# Patient Record
Sex: Male | Born: 1995 | Race: Black or African American | Hispanic: No | Marital: Single | Smoking: Never smoker
Health system: Southern US, Community
[De-identification: ages and names within clinical notes are randomized; demographics above are authoritative.]

---

## 2016-05-09 ENCOUNTER — Emergency Department (HOSPITAL_COMMUNITY): Payer: Self-pay

## 2016-05-09 ENCOUNTER — Inpatient Hospital Stay (HOSPITAL_COMMUNITY)
Admission: EM | Admit: 2016-05-09 | Discharge: 2016-05-11 | DRG: 200 | Disposition: A | Discharge status: Home / Self Care | Payer: Self-pay | Attending: General Surgery | Admitting: General Surgery

## 2016-05-09 ENCOUNTER — Encounter (HOSPITAL_COMMUNITY): Payer: Self-pay | Admitting: Emergency Medicine

## 2016-05-09 ENCOUNTER — Inpatient Hospital Stay (HOSPITAL_COMMUNITY): Payer: Self-pay

## 2016-05-09 DIAGNOSIS — S71011A Laceration without foreign body, right hip, initial encounter: Secondary | ICD-10-CM | POA: Diagnosis present

## 2016-05-09 DIAGNOSIS — Y907 Blood alcohol level of 200-239 mg/100 ml: Secondary | ICD-10-CM | POA: Diagnosis present

## 2016-05-09 DIAGNOSIS — J939 Pneumothorax, unspecified: Secondary | ICD-10-CM | POA: Diagnosis present

## 2016-05-09 DIAGNOSIS — S270XXA Traumatic pneumothorax, initial encounter: Principal | ICD-10-CM | POA: Diagnosis present

## 2016-05-09 DIAGNOSIS — W109XXA Fall (on) (from) unspecified stairs and steps, initial encounter: Secondary | ICD-10-CM | POA: Diagnosis present

## 2016-05-09 DIAGNOSIS — T148XXA Other injury of unspecified body region, initial encounter: Secondary | ICD-10-CM

## 2016-05-09 DIAGNOSIS — S21111A Laceration without foreign body of right front wall of thorax without penetration into thoracic cavity, initial encounter: Secondary | ICD-10-CM | POA: Diagnosis present

## 2016-05-09 DIAGNOSIS — F10129 Alcohol abuse with intoxication, unspecified: Secondary | ICD-10-CM | POA: Diagnosis present

## 2016-05-09 LAB — ABO/RH: ABO/RH(D): O POS

## 2016-05-09 LAB — URINALYSIS, ROUTINE W REFLEX MICROSCOPIC
BACTERIA UA: NONE SEEN
BILIRUBIN URINE: NEGATIVE
Glucose, UA: NEGATIVE mg/dL
KETONES UR: NEGATIVE mg/dL
LEUKOCYTES UA: NEGATIVE
NITRITE: NEGATIVE
Protein, ur: NEGATIVE mg/dL
Specific Gravity, Urine: >1.046 — ABNORMAL HIGH (ref 1.005–1.030)
pH: 5 (ref 5.0–8.0)

## 2016-05-09 LAB — CBC WITH DIFFERENTIAL/PLATELET
BASOS ABS: 0 10*3/uL (ref 0.0–0.1)
Basophils Relative: 0 %
EOS ABS: 0 10*3/uL (ref 0.0–0.7)
EOS PCT: 0 %
HCT: 37.7 % — ABNORMAL LOW (ref 39.0–52.0)
HEMOGLOBIN: 13.2 g/dL (ref 13.0–17.0)
Lymphocytes Relative: 15 %
Lymphs Abs: 2 10*3/uL (ref 0.7–4.0)
MCH: 30.3 pg (ref 26.0–34.0)
MCHC: 35 g/dL (ref 30.0–36.0)
MCV: 86.5 fL (ref 78.0–100.0)
Monocytes Absolute: 0.7 10*3/uL (ref 0.1–1.0)
Monocytes Relative: 5 %
NEUTROS PCT: 80 %
Neutro Abs: 10.4 10*3/uL — ABNORMAL HIGH (ref 1.7–7.7)
PLATELETS: 139 10*3/uL — AB (ref 150–400)
RBC: 4.36 MIL/uL (ref 4.22–5.81)
RDW: 13.3 % (ref 11.5–15.5)
WBC: 13.1 10*3/uL — AB (ref 4.0–10.5)

## 2016-05-09 LAB — COMPREHENSIVE METABOLIC PANEL
ALBUMIN: 4.1 g/dL (ref 3.5–5.0)
ALT: 13 U/L — ABNORMAL LOW (ref 17–63)
AST: 27 U/L (ref 15–41)
Alkaline Phosphatase: 56 U/L (ref 38–126)
Anion gap: 13 (ref 5–15)
BUN: 8 mg/dL (ref 6–20)
CHLORIDE: 112 mmol/L — AB (ref 101–111)
CO2: 21 mmol/L — ABNORMAL LOW (ref 22–32)
Calcium: 8.6 mg/dL — ABNORMAL LOW (ref 8.9–10.3)
Creatinine, Ser: 1.25 mg/dL — ABNORMAL HIGH (ref 0.61–1.24)
GFR calc Af Amer: >60 mL/min (ref >60)
GFR calc non Af Amer: >60 mL/min (ref >60)
GLUCOSE: 114 mg/dL — AB (ref 65–99)
POTASSIUM: 3.3 mmol/L — AB (ref 3.5–5.1)
Sodium: 146 mmol/L — ABNORMAL HIGH (ref 135–145)
Total Bilirubin: 1.1 mg/dL (ref 0.3–1.2)
Total Protein: 6.3 g/dL — ABNORMAL LOW (ref 6.5–8.1)

## 2016-05-09 LAB — I-STAT CHEM 8, ED
BUN: 10 mg/dL (ref 6–20)
Calcium, Ion: 1.05 mmol/L — ABNORMAL LOW (ref 1.15–1.40)
Chloride: 108 mmol/L (ref 101–111)
Creatinine, Ser: 1.3 mg/dL — ABNORMAL HIGH (ref 0.61–1.24)
Glucose, Bld: 113 mg/dL — ABNORMAL HIGH (ref 65–99)
HEMATOCRIT: 41 % (ref 39.0–52.0)
HEMOGLOBIN: 13.9 g/dL (ref 13.0–17.0)
Potassium: 3.2 mmol/L — ABNORMAL LOW (ref 3.5–5.1)
SODIUM: 148 mmol/L — AB (ref 135–145)
TCO2: 23 mmol/L (ref 0–100)

## 2016-05-09 LAB — PREPARE FRESH FROZEN PLASMA
BLOOD PRODUCT EXPIRATION DATE: 201803122359
Blood Product Expiration Date: 201803102359
ISSUE DATE / TIME: 201802180508
ISSUE DATE / TIME: 201802180508
UNIT TYPE AND RH: 6200
UNIT TYPE AND RH: 6200

## 2016-05-09 LAB — TYPE AND SCREEN
Blood Product Expiration Date: 201803172359
Blood Product Expiration Date: 201803172359
ISSUE DATE / TIME: 201802180507
ISSUE DATE / TIME: 201802180507
Unit Type and Rh: 9500
Unit Type and Rh: 9500

## 2016-05-09 LAB — I-STAT CG4 LACTIC ACID, ED: LACTIC ACID, VENOUS: 4.08 mmol/L — AB (ref 0.5–1.9)

## 2016-05-09 LAB — PROTIME-INR
INR: 1.22
Prothrombin Time: 15.4 seconds — ABNORMAL HIGH (ref 11.4–15.2)

## 2016-05-09 LAB — CDS SEROLOGY

## 2016-05-09 LAB — ETHANOL: Alcohol, Ethyl (B): 222 mg/dL — ABNORMAL HIGH (ref <5)

## 2016-05-09 MED ORDER — PANTOPRAZOLE SODIUM 40 MG IV SOLR: 40.0000 mg | Freq: Every day | INTRAVENOUS | Status: DC

## 2016-05-09 MED ORDER — FENTANYL CITRATE (PF) 100 MCG/2ML IJ SOLN
100.0000 ug | Freq: Once | INTRAMUSCULAR | Status: AC
  Administered 2016-05-09: 100 ug via INTRAVENOUS

## 2016-05-09 MED ORDER — OXYCODONE HCL 5 MG PO TABS: 5.0000 mg | ORAL_TABLET | ORAL | Status: DC | PRN

## 2016-05-09 MED ORDER — MIDAZOLAM HCL 2 MG/2ML IJ SOLN
INTRAMUSCULAR | Status: AC
  Filled 2016-05-09: qty 4

## 2016-05-09 MED ORDER — SODIUM CHLORIDE 0.9 % IV BOLUS (SEPSIS)
1000.0000 mL | Freq: Once | INTRAVENOUS | Status: AC
  Administered 2016-05-09: 1000 mL via INTRAVENOUS

## 2016-05-09 MED ORDER — ONDANSETRON HCL 4 MG PO TABS: 4.0000 mg | ORAL_TABLET | Freq: Four times a day (QID) | ORAL | Status: DC | PRN

## 2016-05-09 MED ORDER — SODIUM CHLORIDE 0.9 % IV SOLN
INTRAVENOUS | Status: DC
  Administered 2016-05-09: 06:00:00 via INTRAVENOUS

## 2016-05-09 MED ORDER — PANTOPRAZOLE SODIUM 40 MG PO TBEC
40.0000 mg | DELAYED_RELEASE_TABLET | Freq: Every day | ORAL | Status: DC
  Administered 2016-05-09 – 2016-05-11 (×3): 40 mg via ORAL
  Filled 2016-05-09 (×3): qty 1

## 2016-05-09 MED ORDER — KCL IN DEXTROSE-NACL 20-5-0.45 MEQ/L-%-% IV SOLN
INTRAVENOUS | Status: DC
  Administered 2016-05-09: 14:00:00 via INTRAVENOUS
  Administered 2016-05-10: 1 mL via INTRAVENOUS
  Administered 2016-05-10: 14:00:00 via INTRAVENOUS
  Filled 2016-05-09 (×3): qty 1000

## 2016-05-09 MED ORDER — ACETAMINOPHEN 325 MG PO TABS: 650.0000 mg | ORAL_TABLET | ORAL | Status: DC | PRN

## 2016-05-09 MED ORDER — IOPAMIDOL (ISOVUE-300) INJECTION 61%
100.0000 mL | Freq: Once | INTRAVENOUS | Status: AC | PRN
  Administered 2016-05-09: 100 mL via INTRAVENOUS

## 2016-05-09 MED ORDER — FENTANYL CITRATE (PF) 100 MCG/2ML IJ SOLN
INTRAMUSCULAR | Status: AC
  Filled 2016-05-09: qty 4

## 2016-05-09 MED ORDER — ENOXAPARIN SODIUM 40 MG/0.4ML ~~LOC~~ SOLN
40.0000 mg | SUBCUTANEOUS | Status: DC
  Administered 2016-05-10 – 2016-05-11 (×2): 40 mg via SUBCUTANEOUS
  Filled 2016-05-09 (×2): qty 0.4

## 2016-05-09 MED ORDER — OXYCODONE HCL 5 MG PO TABS
10.0000 mg | ORAL_TABLET | ORAL | Status: DC | PRN
  Administered 2016-05-09 – 2016-05-10 (×3): 10 mg via ORAL
  Filled 2016-05-09 (×3): qty 2

## 2016-05-09 MED ORDER — ONDANSETRON HCL 4 MG/2ML IJ SOLN: 4.0000 mg | Freq: Four times a day (QID) | INTRAMUSCULAR | Status: DC | PRN

## 2016-05-09 MED ORDER — MIDAZOLAM HCL 2 MG/2ML IJ SOLN
4.0000 mg | Freq: Once | INTRAMUSCULAR | Status: AC
  Administered 2016-05-09: 4 mg via INTRAVENOUS

## 2016-05-09 MED ORDER — MORPHINE SULFATE (PF) 2 MG/ML IV SOLN: 2.0000 mg | INTRAVENOUS | Status: DC | PRN

## 2016-05-09 MED ORDER — CEFAZOLIN IN D5W 1 GM/50ML IV SOLN
1.0000 g | Freq: Once | INTRAVENOUS | Status: AC
  Administered 2016-05-09: 1 g via INTRAVENOUS
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
## 2016-05-09 NOTE — ED Notes (Signed)
Portable chest xray at bedside.
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
## 2016-05-09 NOTE — ED Notes (Signed)
Patient taken to CT with RN, EMT and MD.  

## 2016-05-09 NOTE — ED Notes (Signed)
Attempted to call report x 1  

## 2016-05-09 NOTE — ED Notes (Signed)
PT unable to provide UA at this time... 

## 2016-05-09 NOTE — ED Notes (Signed)
Family at bedside. 
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
## 2016-05-10 ENCOUNTER — Inpatient Hospital Stay (HOSPITAL_COMMUNITY): Payer: Self-pay

## 2016-05-10 LAB — CBC
HEMATOCRIT: 38.6 % — AB (ref 39.0–52.0)
Hemoglobin: 13.2 g/dL (ref 13.0–17.0)
MCH: 30.3 pg (ref 26.0–34.0)
MCHC: 34.2 g/dL (ref 30.0–36.0)
MCV: 88.5 fL (ref 78.0–100.0)
Platelets: 111 10*3/uL — ABNORMAL LOW (ref 150–400)
RBC: 4.36 MIL/uL (ref 4.22–5.81)
RDW: 13.4 % (ref 11.5–15.5)
WBC: 8.8 10*3/uL (ref 4.0–10.5)

## 2016-05-10 LAB — BASIC METABOLIC PANEL
Anion gap: 8 (ref 5–15)
BUN: 8 mg/dL (ref 6–20)
CHLORIDE: 108 mmol/L (ref 101–111)
CO2: 25 mmol/L (ref 22–32)
CREATININE: 0.81 mg/dL (ref 0.61–1.24)
Calcium: 8.7 mg/dL — ABNORMAL LOW (ref 8.9–10.3)
GFR calc Af Amer: >60 mL/min (ref >60)
GFR calc non Af Amer: >60 mL/min (ref >60)
Glucose, Bld: 107 mg/dL — ABNORMAL HIGH (ref 65–99)
POTASSIUM: 4.1 mmol/L (ref 3.5–5.1)
Sodium: 141 mmol/L (ref 135–145)

## 2016-05-10 LAB — HIV ANTIBODY (ROUTINE TESTING W REFLEX): HIV Screen 4th Generation wRfx: NONREACTIVE

## 2016-05-10 MED ORDER — OXYCODONE HCL 5 MG PO TABS
5.0000 mg | ORAL_TABLET | ORAL | Status: DC | PRN
  Administered 2016-05-10 – 2016-05-11 (×5): 5 mg via ORAL
  Filled 2016-05-10 (×5): qty 1
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
## 2016-05-11 ENCOUNTER — Inpatient Hospital Stay (HOSPITAL_COMMUNITY): Payer: Self-pay

## 2016-05-11 MED ORDER — BACITRACIN-NEOMYCIN-POLYMYXIN 400-5-5000 EX OINT: TOPICAL_OINTMENT | Freq: Every day | CUTANEOUS | 0 refills | Status: AC

## 2016-05-11 MED ORDER — BACITRACIN-NEOMYCIN-POLYMYXIN 400-5-5000 EX OINT
TOPICAL_OINTMENT | Freq: Every day | CUTANEOUS | Status: DC
  Administered 2016-05-11: 12:00:00 via TOPICAL
  Filled 2016-05-11: qty 1

## 2016-05-11 MED ORDER — BACITRACIN-NEOMYCIN-POLYMYXIN 400-5-5000 EX OINT
TOPICAL_OINTMENT | Freq: Every day | CUTANEOUS | Status: DC
  Filled 2016-05-11 (×9): qty 1

## 2016-05-11 MED ORDER — OXYCODONE HCL 5 MG PO TABS: 5.0000 mg | ORAL_TABLET | ORAL | 0 refills | Status: AC | PRN
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
## 2016-05-11 NOTE — Progress Notes (Signed)
Pt discharged to home.  Discharge instructions explained to pt.  Pt has no questions at the time of discharge.  Changed dressing before pt was discharged home.  Pt states he has all belongings.  IV removed.  Pt ambulated off unit on his own.
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
## 2016-05-20 ENCOUNTER — Telehealth (HOSPITAL_COMMUNITY): Payer: Self-pay

## 2016-05-20 NOTE — Telephone Encounter (Signed)
Returned call to patient. States that he does not yet have his disability forms, but he was wondering who could fill them out for him. Advised patient to call our office [phone]) when he receives the forms and our office can take care of them. No further needs at this time.
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
## 2017-09-19 IMAGING — CR DG CHEST 1V PORT
1 series · 1 of 1 positions shown · non-contrast
Comparison: Portable chest x-ray May 09, 2016

CLINICAL DATA: Follow-up right-sided pneumothorax with small
caliber chest tube treatment

EXAM:
PORTABLE CHEST 1 VIEW

[AP]
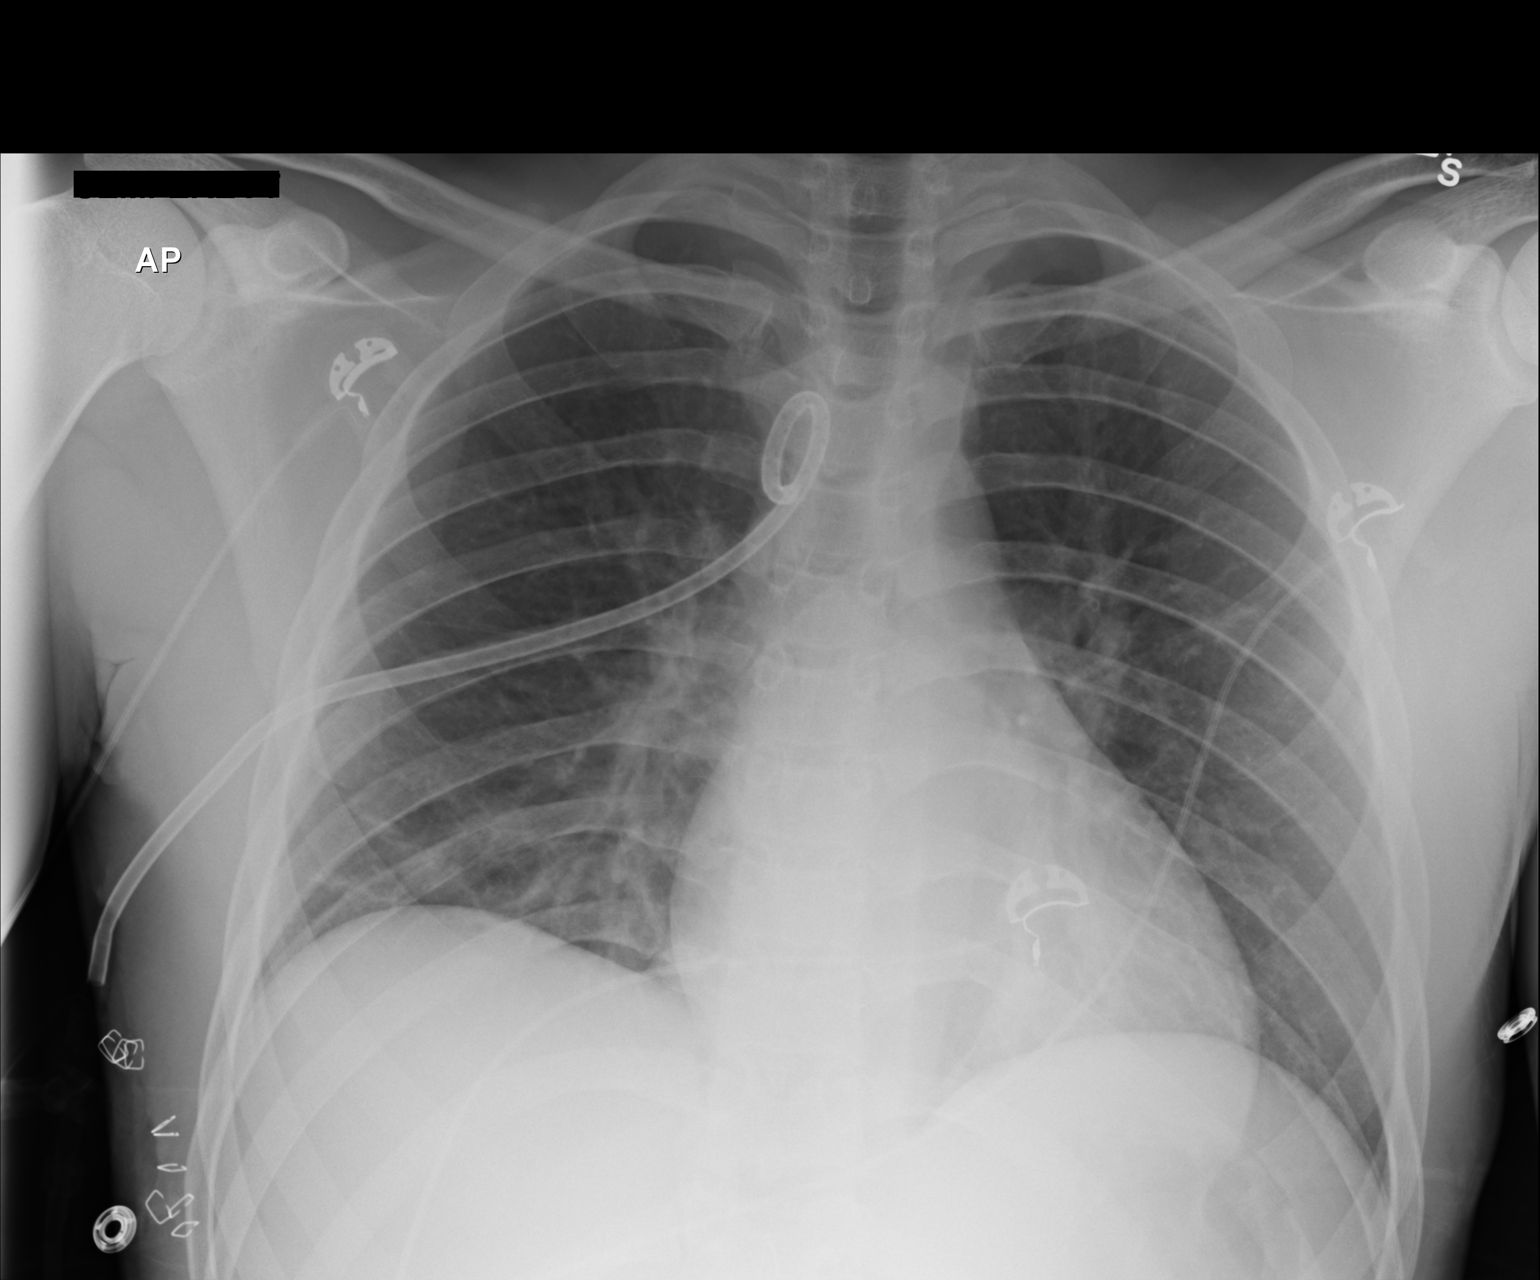

[1 of 1 positions shown; findings below may reference images not displayed]

FINDINGS: The lungs are adequately inflated. No pneumothorax is evident today.
The pigtail of the small caliber chest tube projects over the medial
aspect of the upper right hemithorax at approximately the level of
the fifth rib. There is no mediastinal shift. There is stable
subsegmental atelectasis at the right lung base with improving
density at the left lung base. There is no pleural effusion. The
left lung is clear. The heart and pulmonary vascularity are normal.
IMPRESSION: No residual pneumothorax on the right. Persistent infrahilar
subsegmental atelectasis on the right. There may be subsegmental
atelectasis at the left lung base but this has improved.

## 2017-09-20 IMAGING — DX DG CHEST 1V PORT
1 series · 1 of 1 positions shown · non-contrast
Comparison: 05/10/2016.

CLINICAL DATA: Pneumothorax.  Right chest tube .

EXAM:
PORTABLE CHEST 1 VIEW

[chest ap]
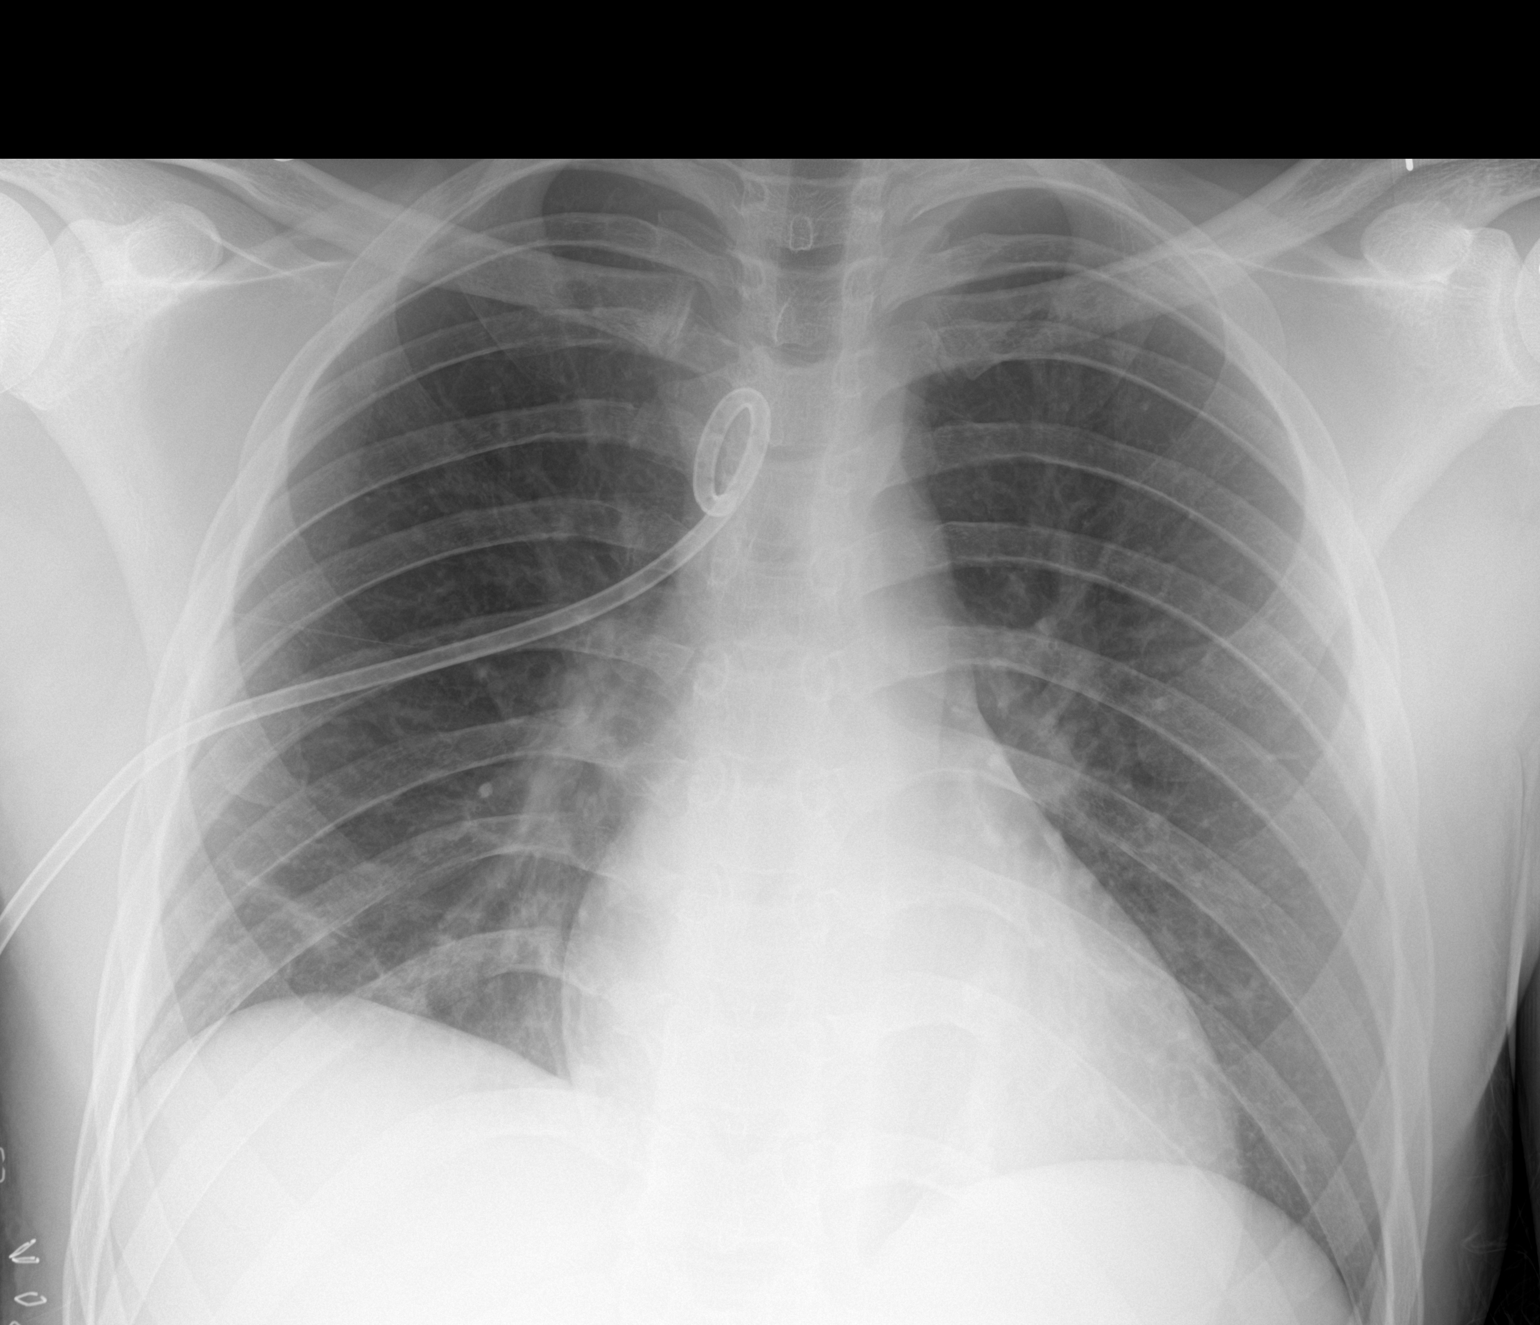

[1 of 1 positions shown; findings below may reference images not displayed]

FINDINGS: Chest tube noted on the right. No pneumothorax. Cardiomegaly with
normal pulmonary vascularity. Mild bibasilar atelectasis and
infiltrates. No pleural effusion. No acute bony abnormality.
Surgical staples right chest.
IMPRESSION: 1.  Right chest tube noted.  No pneumothorax.

2. Mild bibasilar atelectasis and infiltrates .
